# Patient Record
Sex: Female | Born: 1950 | Race: White | Hispanic: No
Health system: Northeastern US, Academic
[De-identification: ages and names within clinical notes are randomized; demographics above are authoritative.]

---

## 2019-11-05 ENCOUNTER — Inpatient Hospital Stay: Admit: 2019-11-05 | Discharge: 2019-11-05 | Payer: PRIVATE HEALTH INSURANCE

## 2019-11-05 DIAGNOSIS — Z20828 Contact with and (suspected) exposure to other viral communicable diseases: Secondary | ICD-10-CM

## 2019-11-06 DIAGNOSIS — Z20822 Contact with and (suspected) exposure to covid-19: Secondary | ICD-10-CM

## 2019-11-06 LAB — COVID-19 CLEARANCE OR FOR PLACEMENT ONLY: BKR SARS-COV-2 RNA (COVID-19) (YH): NOT DETECTED

## 2019-11-18 ENCOUNTER — Inpatient Hospital Stay: Admit: 2019-11-18 | Discharge: 2019-11-18 | Payer: PRIVATE HEALTH INSURANCE

## 2019-11-18 MED ORDER — BENZONATATE 200 MG CAPSULE
200 mg | ORAL_CAPSULE | Freq: Three times a day (TID) | ORAL | 1 refills | Status: AC | PRN
Start: 2019-11-18 — End: ?

## 2019-11-18 MED ORDER — ALBUTEROL SULFATE HFA 90 MCG/ACTUATION AEROSOL INHALER
90 mcg/actuation | Freq: Four times a day (QID) | RESPIRATORY_TRACT | 1 refills | Status: AC | PRN
Start: 2019-11-18 — End: ?

## 2019-11-18 MED ORDER — PREDNISONE 10 MG TABLET
10 mg | ORAL_TABLET | 1 refills | Status: AC
Start: 2019-11-18 — End: ?

## 2019-11-18 NOTE — ED Provider Notes
HistoryChief Complaint Patient presents with ? Cough   COUGH.PT RECENTLY TREATED FOR PNEUMONIA.  Anita Anderson is a 69 y.o. female patient who presents to the [facility] today with complaints of a cough and SOB. Patient states that she has Pneumonia last week to which she is being treated for but states that she feels better but still has a cough, wheezing, and cannot catch her breath. Notes that she finished the inhaler within a 10 day period. Notes that she had a CXR with her PCP. Denies fevers, chills, and any other medical complaints at this time. Notes that she asked for azithromycin since it always treats her but that he gave her doxycycline.  Past Medical History: Diagnosis Date ? Cervical ca [facility] Code)  ? 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 Meningitis  Past Surgical History: Procedure Laterality Date ? CHOLECYSTECTOMY   ? harrington rod surg x 3   ? HYSTERECTOMY   ? PARATHYROID GLAND SURGERY   Family History Problem Relation Age of Onset ? Depression Son  Social History Socioeconomic History ? Marital status: Divorced   Spouse name: Not on file ? Number of children: Not on file ? Years of education: Not on file ? Highest education level: Not on file Tobacco Use ? Smoking status: Former Smoker ? Smokeless tobacco: Never Used Vaping Use ? Vaping Use: Never used Substance and Sexual Activity ? Alcohol use: Yes   Comment: social ? 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 E-cigarette status Never User  ? E-Cigarette Use Never User  E-cigarette/Vaping Substances E-cigarette/Vaping Devices Review of Systems Constitutional: Negative for chills, fatigue and fever. HENT: Negative for congestion, rhinorrhea, sneezing and sore throat.  Respiratory: Positive for cough, shortness of breath and wheezing. Negative for chest tightness.  Cardiovascular: Negative for chest pain. Gastrointestinal: Negative for abdominal pain, diarrhea and vomiting. Musculoskeletal: Negative for arthralgias and myalgias. Neurological: Negative for dizziness, weakness and headaches.  Physical ExamED Triage Vitals [11/18/19 1225]BP: 128/80Pulse: 61Pulse from  O2 sat: n/aResp: 16Temp: 98.2 ?F (36.8 ?C)Temp src: n/aSpO2: 99 % BP 128/80  - Pulse 61  - Temp 98.2 ?F (36.8 ?C)  - Resp 16  - SpO2 99% Physical ExamVitals and nursing note reviewed. Constitutional:     General: She is not in acute distress.   Appearance: Normal appearance. She is not ill-appearing, toxic-appearing or diaphoretic. HENT:    Head: Normocephalic and atraumatic.    Nose: Nose normal. No congestion or rhinorrhea.    Mouth/Throat:    Mouth: Mucous membranes are moist.    Pharynx: Oropharynx is clear. No oropharyngeal exudate or posterior oropharyngeal erythema. Eyes:    General: No scleral icterus.   Conjunctiva/sclera: Conjunctivae normal. Cardiovascular:    Rate and Rhythm: Normal rate and regular rhythm.    Heart sounds: Normal heart sounds. Pulmonary:    Effort: Pulmonary effort is normal. No respiratory distress.    Breath sounds: Normal breath sounds. No wheezing, rhonchi or rales. Musculoskeletal:    Cervical back: Normal range of motion. Skin:   General: Skin is warm and dry. Neurological:    General: No focal deficit present.    Mental Status: She  is alert and oriented to person, place, and time. Psychiatric:       Mood and Affect: Mood normal.       Behavior: Behavior normal.  ProceduresProcedures ED COURSEReviewed previous: previous chartInterpreted by ED Provider: pulse oximetryPatient Reevaluation: Anita Anderson is a 69 y.o. female patient who presents to the [facility] today with complaints of a cough and SOB. Patient states that she has Pneumonia last week to which she is being treated for but states that she feels better but still has a cough, wheezing, and cannot catch her breath. Notes that she finished the inhaler within a 10 day period. Notes that she had a CXR with her PCP. Denies fevers, chills, and any other medical complaints at this time. Notes that she asked for azithromycin since it always treats her but that he gave her doxycycline. Plan:Instructed pt ZO:[id number] use inhaler and start taper prednisone as discussed.  You may also use Tessalon Perles for cough.  We do recommend that you follow-up to primary care physician.  Please drink plenty of fluidsPatient expresses understanding to the following instructions and was discharged. Diagnosis:The encounter diagnosis was Cough, persistent.Patient progress: stableClinical Impressions as of Nov 18 1306 Cough, persistent  ED DispositionDischargeBy signing my name below, I, Erling Cruz, attest that this documentation has been prepared under the direction and in the presence of Tresa Endo, Erika10/17/21 1309This chart was generated with the assistance of a scribe. I was present and performed the services documented. I have reviewed the document and verified its accuracy.  Idalia Needle, MD10/17/21 1326

## 2019-11-18 NOTE — Discharge Instructions
Please use inhaler and start taper prednisone as discussed.  You may also use Tessalon Perles for cough.  We do recommend that you follow-up to primary care physician.  Please drink plenty of fluids

## 2023-02-08 ENCOUNTER — Telehealth: Admit: 2023-02-08 | Payer: PRIVATE HEALTH INSURANCE

## 2023-02-08 NOTE — Telephone Encounter
 Received call from: Contacts   Contact Date/Time Type Contact Phone/Fax  02/08/2023 11:17 AM EST Phone (Incoming) Iverson, Beilfuss (Self) [phone] (M)   Patient needs to schedule appointment for AprilType of appointment: Office visit and LabsBest telephone number for call back: [phone] time to return call: No particular time of dayPermission to leave message: Yes

## 2023-02-16 NOTE — Telephone Encounter
 Pt is calling to schedule an appt with Dr Francoise Ceo.  Best contact number to reach pt is [phone]

## 2023-03-14 IMAGING — MR MRI LEFT SHOULDER WITHOUT CONTRAST
5 of 6 series · 27 of 40 positions shown · IV contrast (gadolinium)
Comparison: Radiographs of 02/28/2023.

________________________________________________________________________________________________ 
MRI LEFT SHOULDER WITHOUT CONTRAST, 03/14/2023 [DATE]: 
CLINICAL INDICATION: Sprain of left rotator cuff capsule, initial encounter . 
Acute on chronic. Evaluate rotator cuff. Subscapularis syndrome.
TECHNIQUE: Multiplanar, multiecho position MR images of the shoulder were 
performed without intravenous gadolinium enhancement. Patient was scanned on a 
1.5T magnet.

[Series 201: survey left · axial · left · 10.0mm · 0.71mm/px · z∈[-40,+125]mm · 5 of 15 slices shown]
[im 1/15]
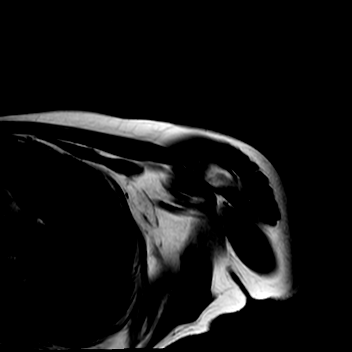
[im 4/15]
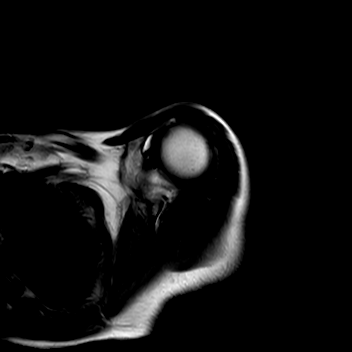
[im 8/15]
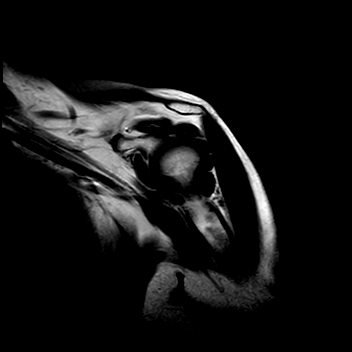
[im 11/15]
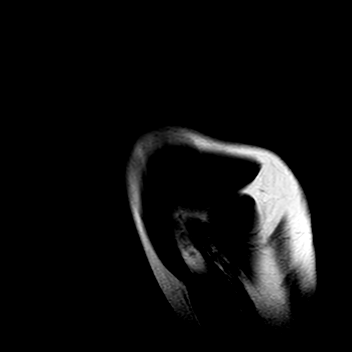
[im 15/15]
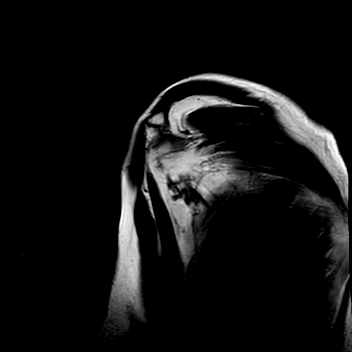

[Series 301: (person_name)_(person_name)_(person_name) · axial · left · 3.5mm · 0.42mm/px · z∈[-102,-9]mm · 7 of 28 slices shown]
[im 1/28]
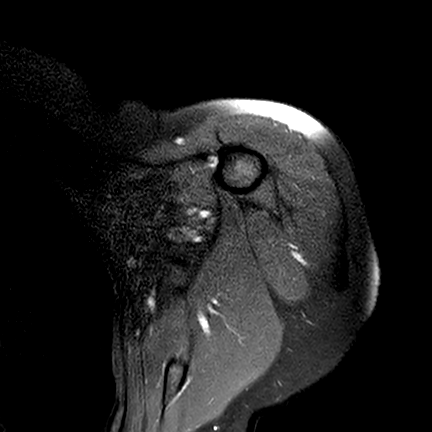
[im 5/28]
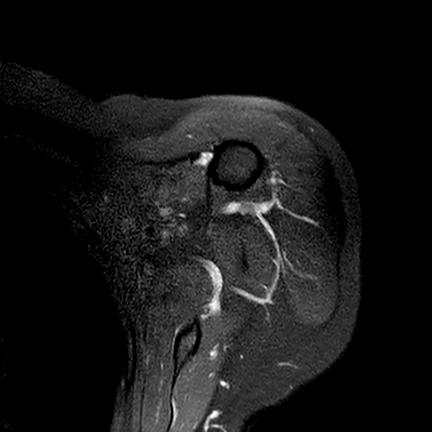
[im 10/28]
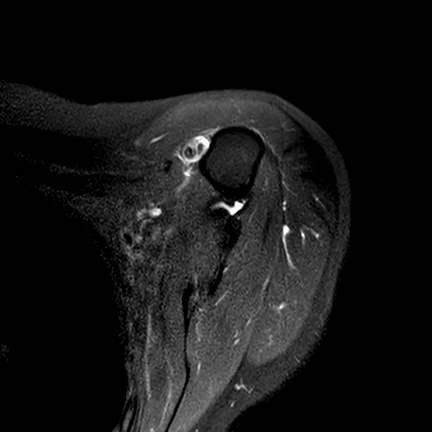
[im 14/28]
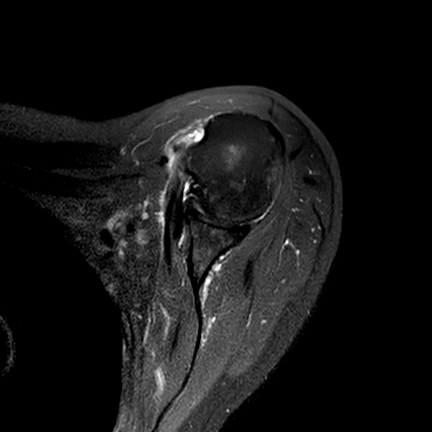
[im 19/28]
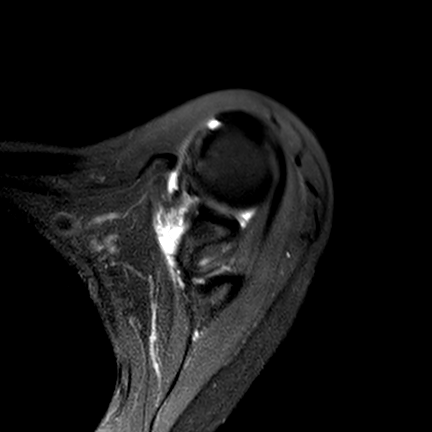
[im 23/28]
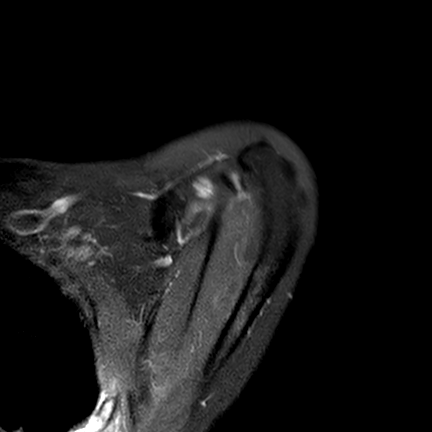
[im 28/28]
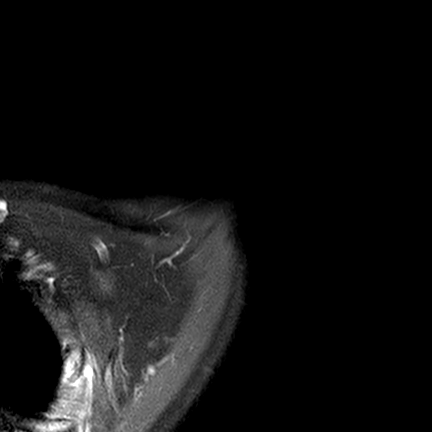

[Series 401: t2_fs_sag left · coronal · left · 3.5mm · 0.37mm/px · 8 of 30 slices shown]
[im 1/30]
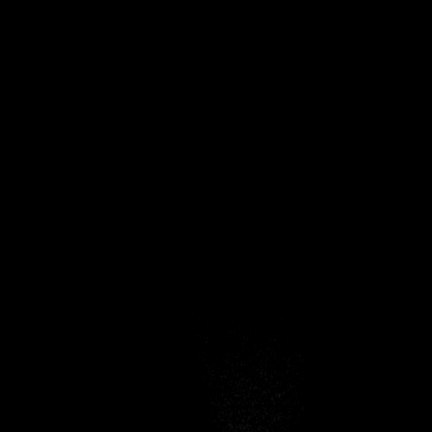
[im 5/30]
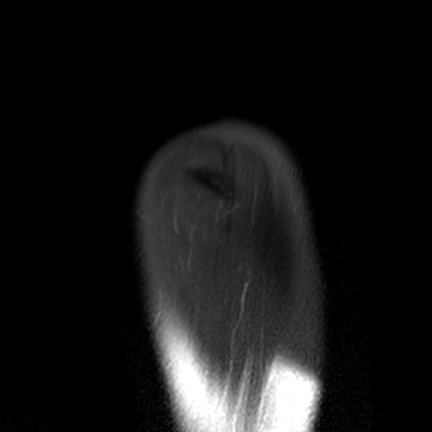
[im 9/30]
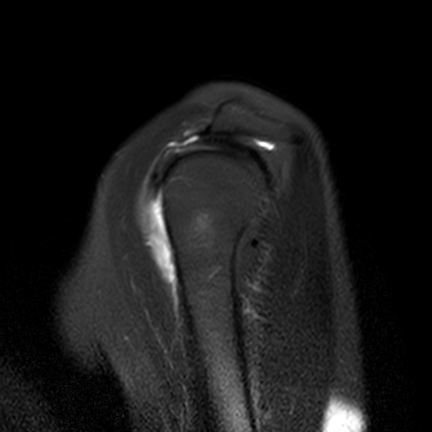
[im 13/30]
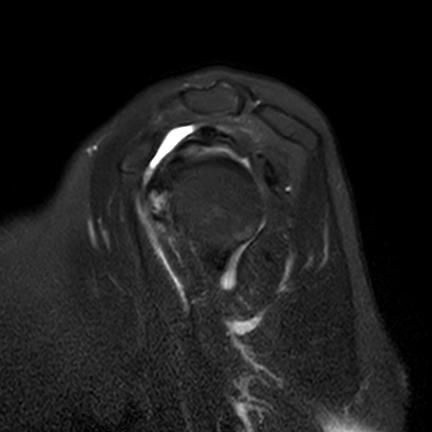
[im 17/30]
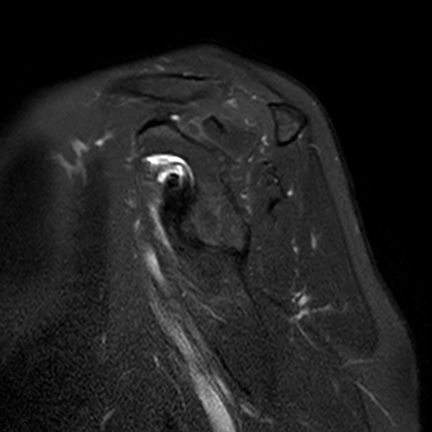
[im 21/30]
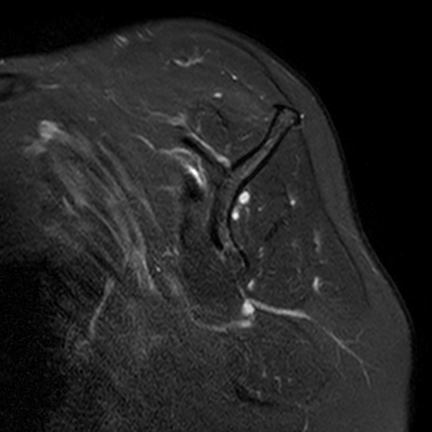
[im 25/30]
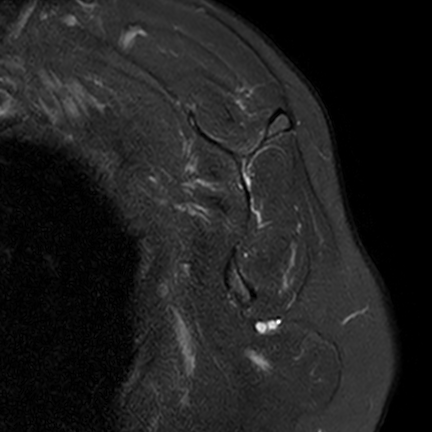
[im 30/30]
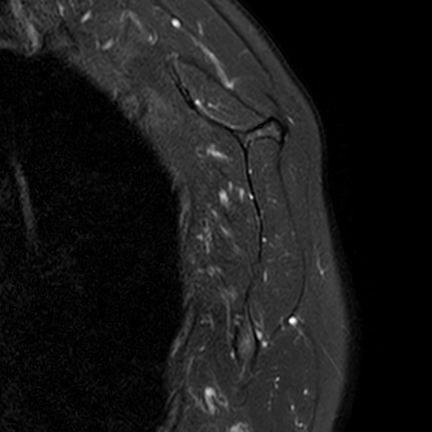

[Series 501: pd_fs_cor left · oblique · left · 3.5mm · 0.40mm/px · 6 of 24 slices shown]
[im 1/24]
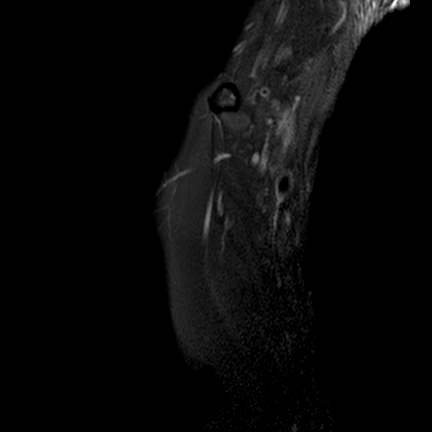
[im 5/24]
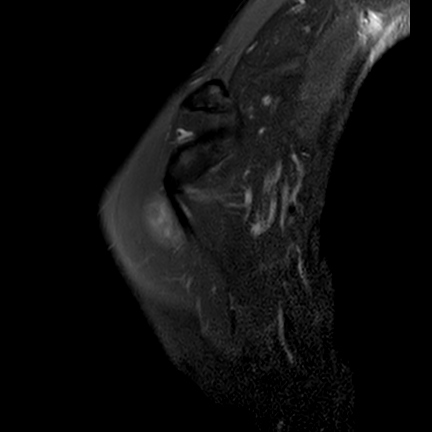
[im 10/24]
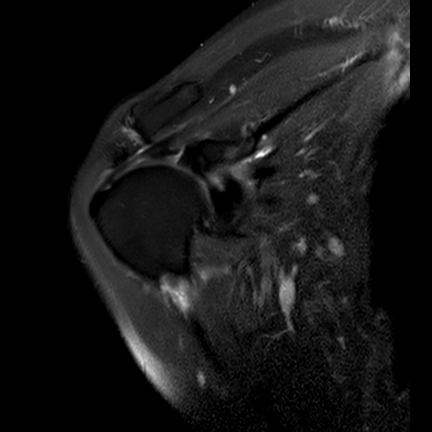
[im 14/24]
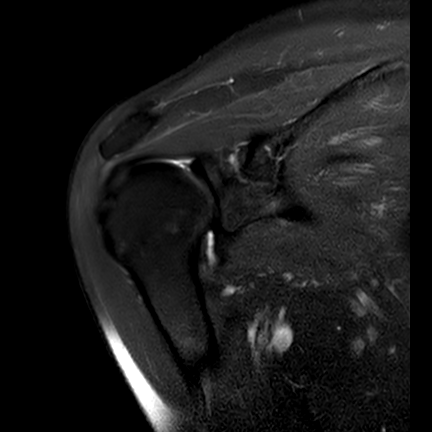
[im 19/24]
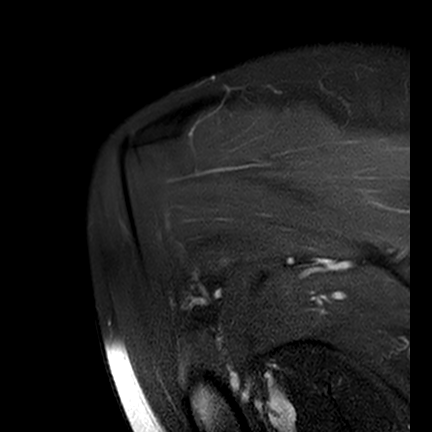
[im 24/24]
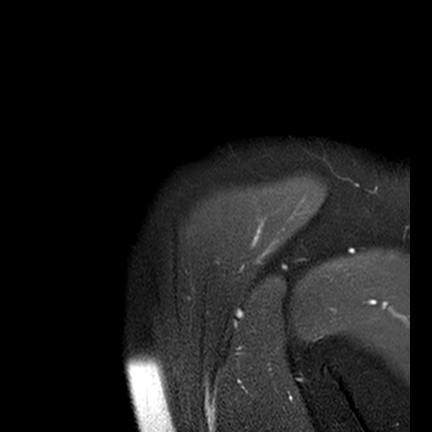

[Series 601: t1_sag left · coronal · left · 3.5mm · 0.31mm/px · 1 of 30 slices shown]
[im 1/30]
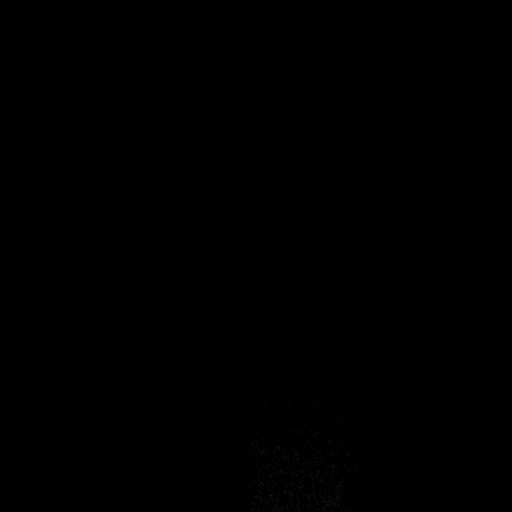

[27 of 40 positions shown; findings below may reference images not displayed]

FINDINGS: ROTATOR CUFF: There is full-thickness tear of the anterior insertion of the 
supraspinatus measuring approximately 9 mm AP. There is partial tearing of the 
superior insertion of the subscapularis. The rotator cuff musculature is 
symmetric without mass, signal abnormality or atrophy. 
ACROMIOCLAVICULAR JOINT: Minimal hypertrophic changes of the acromioclavicular 
joint without mass effect upon the supraspinatus. The coracoacromial ligament is 
intact without prominent spurring at the acromial attachment. The 
acromioclavicular and coracoclavicular ligaments are preserved. The acromium is 
normal in morphology. 
GLENOHUMERAL JOINT: The humeral head is well located within the glenoid fossa. 
Articular cartilage is preserved.  The glenoid labrum is preserved. No 
paralabral cyst. Small shoulder joint effusion. There is longitudinal split 
thickness tearing of the proximal long head biceps tendon with partial medial 
subluxation from the proximal bicipital groove. 
BONES: The bone marrow signal intensity is negative for fracture. No Hill-Sachs 
defects. 
ADDITIONAL FINDINGS: The axillary region is negative. Subcutaneous tissues are 
negative.
IMPRESSION: 1.  Full-thickness tear of the anterior insertion of the supraspinatus. 
2.  Partial tearing of the superior insertion of the subscapularis. 
3.  Medial subluxation with longitudinal split thickness tearing of the proximal 
long head of the biceps tendon.

## 2023-05-22 ENCOUNTER — Encounter: Admit: 2023-05-22 | Payer: PRIVATE HEALTH INSURANCE

## 2023-05-22 DIAGNOSIS — D472 Monoclonal gammopathy: Secondary | ICD-10-CM

## 2023-05-23 ENCOUNTER — Inpatient Hospital Stay: Admit: 2023-05-23 | Discharge: 2023-05-23 | Payer: PRIVATE HEALTH INSURANCE

## 2023-05-23 ENCOUNTER — Encounter: Admit: 2023-05-23 | Payer: PRIVATE HEALTH INSURANCE

## 2023-05-23 ENCOUNTER — Ambulatory Visit: Admit: 2023-05-23 | Payer: PRIVATE HEALTH INSURANCE

## 2023-05-23 VITALS — BP 125/81 | HR 68 | Temp 98.10000°F | Resp 18 | Ht 65.354 in | Wt 153.9 lb

## 2023-05-23 DIAGNOSIS — D472 Monoclonal gammopathy: Secondary | ICD-10-CM

## 2023-05-23 DIAGNOSIS — C539 Malignant neoplasm of cervix uteri, unspecified: Secondary | ICD-10-CM

## 2023-05-23 DIAGNOSIS — G039 Meningitis, unspecified: Secondary | ICD-10-CM

## 2023-05-23 DIAGNOSIS — C541 Malignant neoplasm of endometrium: Secondary | ICD-10-CM

## 2023-05-23 LAB — CBC WITH AUTO DIFFERENTIAL
BKR WAM ABSOLUTE IMMATURE GRANULOCYTES.: 0.01 x 1000/ÂµL (ref 0.00–0.30)
BKR WAM ABSOLUTE LYMPHOCYTE COUNT.: 1.74 x 1000/ÂµL (ref 0.60–3.70)
BKR WAM ABSOLUTE NRBC (2 DEC): 0 x 1000/ÂµL (ref 0.00–1.00)
BKR WAM ANC (ABSOLUTE NEUTROPHIL COUNT): 2.55 x 1000/ÂµL (ref 2.00–7.60)
BKR WAM BASOPHIL ABSOLUTE COUNT.: 0.04 x 1000/ÂµL (ref 0.00–1.00)
BKR WAM BASOPHILS: 0.8 % (ref 0.0–1.4)
BKR WAM EOSINOPHIL ABSOLUTE COUNT.: 0.07 x 1000/ÂµL (ref 0.00–1.00)
BKR WAM EOSINOPHILS: 1.5 % (ref 0.0–5.0)
BKR WAM HEMATOCRIT (2 DEC): 42.5 % (ref 35.00–45.00)
BKR WAM HEMOGLOBIN: 14.4 g/dL (ref 11.7–15.5)
BKR WAM IMMATURE GRANULOCYTES: 0.2 % (ref 0.0–1.0)
BKR WAM LYMPHOCYTES: 36.4 % (ref 17.0–50.0)
BKR WAM MCH (PG): 32.4 pg (ref 27.0–33.0)
BKR WAM MCHC: 33.9 g/dL (ref 31.0–36.0)
BKR WAM MCV: 95.7 fL (ref 80.0–100.0)
BKR WAM MONOCYTE ABSOLUTE COUNT.: 0.37 x 1000/ÂµL (ref 0.00–1.00)
BKR WAM MONOCYTES: 7.7 % (ref 4.0–12.0)
BKR WAM MPV: 10.1 fL (ref 8.0–12.0)
BKR WAM NEUTROPHILS: 53.4 % (ref 39.0–72.0)
BKR WAM NUCLEATED RED BLOOD CELLS: 0 % (ref 0.0–1.0)
BKR WAM PLATELETS: 259 x1000/ÂµL (ref 150–420)
BKR WAM RDW-CV: 13.2 % (ref 11.0–15.0)
BKR WAM RED BLOOD CELL COUNT.: 4.44 M/ÂµL (ref 4.00–6.00)
BKR WAM WHITE BLOOD CELL COUNT: 4.8 x1000/ÂµL (ref 4.0–11.0)

## 2023-05-23 LAB — COMPREHENSIVE METABOLIC PANEL
BKR A/G RATIO: 0.8 — ABNORMAL LOW (ref 1.0–2.2)
BKR ALANINE AMINOTRANSFERASE (ALT): 15 U/L (ref 10–35)
BKR ALBUMIN: 4 g/dL (ref 3.6–5.1)
BKR ALKALINE PHOSPHATASE: 76 U/L (ref 9–122)
BKR ASPARTATE AMINOTRANSFERASE (AST): 20 U/L (ref 10–35)
BKR AST/ALT RATIO: 1.3
BKR BILIRUBIN TOTAL: 0.5 mg/dL (ref <=1.2)
BKR BLOOD UREA NITROGEN: 15 mg/dL (ref 8–23)
BKR BUN / CREAT RATIO: 16.9 (ref 8.0–23.0)
BKR CALCIUM: 9.9 mg/dL (ref 8.8–10.2)
BKR CHLORIDE: 104 mmol/L (ref 98–107)
BKR CO2: 24 mmol/L (ref 20–30)
BKR CREATININE: 0.89 mg/dL (ref 0.40–1.30)
BKR EGFR, CREATININE (CKD-EPI 2021): >60 mL/min/{1.73_m2} (ref >=60)
BKR GLOBULIN: 5.1 g/dL — ABNORMAL HIGH (ref 2.0–3.9)
BKR GLUCOSE: 103 mg/dL — ABNORMAL HIGH (ref 70–100)
BKR POTASSIUM: 4.1 mmol/L (ref 3.3–5.1)
BKR PROTEIN TOTAL: 9.1 g/dL — ABNORMAL HIGH (ref 5.9–8.3)

## 2023-05-23 LAB — FREE KAPPA LAMBDA WITH RATIO, SERUM
BKR KAPPA FLC, SERUM: 0.91 mg/dL (ref 0.33–1.94)
BKR KAPPA/LAMBDA FLC RATIO, SERUM: 2.6 — ABNORMAL HIGH (ref 0.26–1.65)
BKR LAMBDA FLC, SERUM: 0.35 mg/dL — ABNORMAL LOW (ref 0.57–2.63)

## 2023-05-23 LAB — TOTAL PROTEIN AND ALBUMIN,SERUM  (YH)
BKR ALBUMIN, SERUM: 4.1 g/dL (ref 3.6–4.9)
BKR TOTAL PROTEIN, SERUM: 8.3 g/dL (ref 6.0–8.3)

## 2023-05-23 LAB — DIRECT SODIUM (LAB ORDERABLE ONLY) (BH YH): BKR DIRECT SODIUM: 141 mmol/L (ref 136–144)

## 2023-05-23 LAB — IMMUNOGLOBULINS IGG, IGA, IGM
BKR IGA: 13 mg/dL — ABNORMAL LOW (ref 70–470)
BKR IGG: 3568 mg/dL — ABNORMAL HIGH (ref 700–1600)
BKR IGM: 11 mg/dL — ABNORMAL LOW (ref 40–230)

## 2023-05-24 ENCOUNTER — Telehealth: Admit: 2023-05-24 | Payer: PRIVATE HEALTH INSURANCE

## 2023-05-24 LAB — PROTEIN ELECTROPHORESIS, SERUM    (YH)
BKR ALBUMIN ELP: 4.24 g/dL (ref 3.50–4.70)
BKR ALPHA-1-GLOBULIN: 0.17 g/dL (ref 0.10–0.30)
BKR ALPHA-2-GLOBULIN: 0.72 g/dL (ref 0.60–1.00)
BKR BETA GLOBULIN: 0.62 g/dL — ABNORMAL LOW (ref 0.70–1.20)
BKR GAMMA GLOBULIN: 2.54 g/dL — ABNORMAL HIGH (ref 0.70–1.50)

## 2023-05-24 NOTE — Telephone Encounter
 Spoke with patient regarding labs, M spike and light chain ratio stable. Follow up as planned.

## 2023-05-24 NOTE — Progress Notes
 Patient: Anita Anderson (August 21, 1950)MRN: [id number] Provider: Delice Felt, MDDate of service: 4/21/2025FOLLOWUP NOTEDIAGNOSIS:10/14/20: 73 yo female with pmh history of endometrial and cervical cancer s/p TAH, scoliosis s/p Harrington rod placement complicated by osteomyelitis treated with IV abx, pancreatitis and recurrent pneumonia who is referred by Dr. 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 She says that she was told she had multiple myeloma and needed a stem cell transplant around 2005 at [facility]. She was given a different opinion and [facility], namely that she did not have MM. She says her latest bone marrow biopsy was in 2017 at [facility]. She says she has not had her MGUS evaluated since that time by a hematologist. She feels very well and has no complaints. She denies fevers, chills, night sweats, weight loss, chest pain, shortness of breath, abdominal pain, nausea, vomiting, diarrhea, early satiety, abnormal bleeding or bruising, leg pain or swelling, rashes, bone pain or joint pain.INTERVAL HISTORY: 05/23/23: Feels well other than expected pain in her left shoulder as she's 5 weeks post-rotator cuff repair. No other sites of pain. Energy is normal.Medication: Medication reconciliation was performed and available in the electronic medical record.ROS: (pertinent positives in bold)General:	 weakness, fatigue, fever, weight loss, anorexiaSkin: rashes, pruritus, infections, bruising or bleedingHEENT:  headaches, eye pain, blurred vision, hearing loss, earache, postnasal drip, sore throat, dysphagia, Resp:  cough, sputum, hemoptysis, wheeze, SOB, DOECardiac:	chest pain, chest pressure or palpitationsGI:  nausea, vomiting, abdominal pain, cramping, diarrhea, constipation, melenaGU:   urinary urgency, frequency, dysuria, nocturia, discharge, lesionsExt:  swelling, pain, cramps, paresthesiasMS: joint swelling, joint pain, muscle pain, backacheNeuro:  tremors, sensory loss, weakness  Psych: agitation, anxiety, depressionPHYSICAL EXAM:Vitals:  05/23/23 1036 BP: 125/81 Pulse: 68 Resp: 18 Temp: 98.1 ?F (36.7 ?C) Weight: 69.8 kg GEN: WD/WN, no acute distressENT: oropharynx without thrush, mucositis, or bleeding. EYES: sclera anicteric, no ptosis; pupils equal, reactive to light.RESP: normal respiratory effort; lungs clear to auscultation; and percussion.CV: regular rate and rhythm, normal S1 and S2, no murmurs; no peripheral edema.ABDOMEN: soft, NT/ND, no palpable masses; no hepato- or splenomegaly.LYMPH: no palpable cervical, supraclavicular, axillary, or inguinal adenopathy.MUSCULOSKELETAL: no clubbing or cyanosis; no tenderness over the ribs, spine or long bones.SKIN: no rash or petechiae.PSYCH: judgement and insight normal; no depression or anxiety.NEURO: alert and oriented x 3; no focal motor deficits. 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 Marrow biopsy done on 10/29/20 showed 10% plasma cells with kappa restriction. No current evidence of CRAB. Continue to monitor closely.-await spep, light chains, immunoglobulins.-follow up in four months with cbc, cmp, spep, light chains, immunoglobulins. Collins Dean, MDElectronically Signed by Delice Felt, MD, April 21, 2025On the day of this patient's encounter, a total of 30 minutes was personally spent by me.  This does not include any resident/fellow teaching time, or any time spent performing a procedural service.

## 2023-05-25 LAB — IMMUNOFIXATION ELECTROPHORESIS, SERUM

## 2023-06-06 ENCOUNTER — Ambulatory Visit: Admit: 2023-06-06 | Payer: PRIVATE HEALTH INSURANCE

## 2023-07-13 ENCOUNTER — Telehealth: Admit: 2023-07-13 | Payer: PRIVATE HEALTH INSURANCE

## 2023-07-13 NOTE — Telephone Encounter
 Left a detailed message to call and r/s Md and Labs provide is on vacation.Gb

## 2023-08-19 ENCOUNTER — Ambulatory Visit: Admit: 2023-08-19 | Payer: PRIVATE HEALTH INSURANCE

## 2023-09-04 ENCOUNTER — Encounter: Admit: 2023-09-04 | Payer: PRIVATE HEALTH INSURANCE

## 2023-09-04 DIAGNOSIS — D472 Monoclonal gammopathy: Principal | ICD-10-CM

## 2023-09-05 ENCOUNTER — Encounter: Admit: 2023-09-05 | Payer: PRIVATE HEALTH INSURANCE

## 2023-09-05 ENCOUNTER — Inpatient Hospital Stay: Admit: 2023-09-05 | Discharge: 2023-09-05 | Payer: PRIVATE HEALTH INSURANCE

## 2023-09-05 ENCOUNTER — Ambulatory Visit: Admit: 2023-09-05 | Payer: PRIVATE HEALTH INSURANCE

## 2023-09-05 VITALS — BP 127/74 | HR 64 | Temp 98.00000°F | Resp 17 | Wt 156.3 lb

## 2023-09-05 DIAGNOSIS — C541 Malignant neoplasm of endometrium: Secondary | ICD-10-CM

## 2023-09-05 DIAGNOSIS — D472 Monoclonal gammopathy: Principal | ICD-10-CM

## 2023-09-05 DIAGNOSIS — C539 Malignant neoplasm of cervix uteri, unspecified: Secondary | ICD-10-CM

## 2023-09-05 DIAGNOSIS — G039 Meningitis, unspecified: Principal | ICD-10-CM

## 2023-09-05 LAB — COMPREHENSIVE METABOLIC PANEL
BKR A/G RATIO: 0.8 — ABNORMAL LOW (ref 1.0–2.2)
BKR ALANINE AMINOTRANSFERASE (ALT): 17 U/L (ref 10–35)
BKR ALBUMIN: 4.1 g/dL (ref 3.6–5.1)
BKR ALKALINE PHOSPHATASE: 87 U/L (ref 9–122)
BKR ANION GAP: 10 (ref 7–17)
BKR ASPARTATE AMINOTRANSFERASE (AST): 22 U/L (ref 10–35)
BKR AST/ALT RATIO: 1.3
BKR BILIRUBIN TOTAL: 0.7 mg/dL (ref <=1.2)
BKR BLOOD UREA NITROGEN: 13 mg/dL (ref 8–23)
BKR BUN / CREAT RATIO: 16.9 (ref 8.0–23.0)
BKR CALCIUM: 9.8 mg/dL (ref 8.8–10.2)
BKR CHLORIDE: 106 mmol/L (ref 98–107)
BKR CO2: 23 mmol/L (ref 20–30)
BKR CREATININE DELTA: -0.12
BKR CREATININE: 0.77 mg/dL (ref 0.40–1.30)
BKR EGFR, CREATININE (CKD-EPI 2021): >60 mL/min/1.73m2 (ref >=60)
BKR GLOBULIN: 4.9 g/dL — ABNORMAL HIGH (ref 2.0–3.9)
BKR GLUCOSE: 97 mg/dL (ref 70–100)
BKR POTASSIUM: 4.1 mmol/L (ref 3.3–5.1)
BKR PROTEIN TOTAL: 9 g/dL — ABNORMAL HIGH (ref 5.9–8.3)
BKR SODIUM: 139 mmol/L (ref 136–144)

## 2023-09-05 LAB — CBC WITH AUTO DIFFERENTIAL
BKR WAM ABSOLUTE IMMATURE GRANULOCYTES.: 0.01 x 1000/ÂµL (ref 0.00–0.30)
BKR WAM ABSOLUTE LYMPHOCYTE COUNT.: 1.06 x 1000/ÂµL (ref 0.60–3.70)
BKR WAM ABSOLUTE NRBC: 0 x 1000/ÂµL (ref 0.00–1.00)
BKR WAM ANC (ABSOLUTE NEUTROPHIL COUNT): 2.64 x 1000/ÂµL (ref 2.00–7.60)
BKR WAM BASOPHIL ABSOLUTE COUNT.: 0.02 x 1000/ÂµL (ref 0.00–1.00)
BKR WAM BASOPHILS: 0.5 % (ref 0.0–1.4)
BKR WAM EOSINOPHIL ABSOLUTE COUNT.: 0.03 x 1000/ÂµL (ref 0.00–1.00)
BKR WAM EOSINOPHILS: 0.8 % (ref 0.0–5.0)
BKR WAM HEMATOCRIT: 43.7 % (ref 35.00–45.00)
BKR WAM HEMOGLOBIN: 14.6 g/dL (ref 11.7–15.5)
BKR WAM IMMATURE GRANULOCYTES: 0.3 % (ref 0.0–1.0)
BKR WAM LYMPHOCYTES: 26.7 % (ref 17.0–50.0)
BKR WAM MCH: 31.6 pg (ref 27.0–33.0)
BKR WAM MCHC: 33.4 g/dL (ref 31.0–36.0)
BKR WAM MCV: 94.6 fL (ref 80.0–100.0)
BKR WAM MONOCYTE ABSOLUTE COUNT.: 0.21 x 1000/ÂµL (ref 0.00–1.00)
BKR WAM MONOCYTES: 5.3 % (ref 4.0–12.0)
BKR WAM MPV: 10.7 fL (ref 8.0–12.0)
BKR WAM NEUTROPHILS: 66.4 % (ref 39.0–72.0)
BKR WAM NUCLEATED RED BLOOD CELLS: 0 % (ref 0.0–1.0)
BKR WAM PLATELETS: 227 x1000/ÂµL (ref 150–420)
BKR WAM RDW-CV: 13.2 % (ref 11.0–15.0)
BKR WAM RED BLOOD CELL COUNT.: 4.62 M/ÂµL (ref 4.00–6.00)
BKR WAM WHITE BLOOD CELL COUNT: 4 x1000/ÂµL (ref 4.0–11.0)

## 2023-09-05 LAB — TOTAL PROTEIN AND ALBUMIN,SERUM  (YH)
BKR ALBUMIN, SERUM: 4.1 g/dL (ref 3.6–4.9)
BKR TOTAL PROTEIN, SERUM: 8 g/dL (ref 6.0–8.3)

## 2023-09-05 LAB — IMMUNOGLOBULINS IGG, IGA, IGM
BKR IGA: 13 mg/dL — ABNORMAL LOW (ref 70–470)
BKR IGG: 3190 mg/dL — ABNORMAL HIGH (ref 700–1600)
BKR IGM: 10 mg/dL — ABNORMAL LOW (ref 40–230)

## 2023-09-05 MED ORDER — DICLOFENAC SODIUM 75 MG TABLET,DELAYED RELEASE
75 | Freq: Two times a day (BID) | ORAL | 1.00 refills | 30.00000 days | Status: AC
Start: 2023-09-05 — End: ?

## 2023-09-05 NOTE — Progress Notes
 Patient: Anita Anderson ([birth year])MRN: [id number] Provider: Toribio Omega Grow, MDDate of service: 8/4/2025FOLLOWUP NOTEDIAGNOSIS:10/14/20: 73 yo female with pmh history of endometrial and cervical cancer s/p TAH, scoliosis s/p Harrington rod placement complicated by osteomyelitis treated with IV abx, pancreatitis and recurrent pneumonia who is referred by Dr. 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 Anita Anderson says she was first told that she had an MGUS in her 52s. She says that she was told she had multiple myeloma and needed a stem cell transplant around 2005 at [facility]. She was given a different opinion and [facility], namely that she did not have MM. She says her latest bone marrow biopsy was in 2017 at [facility]. She says she has not had her MGUS evaluated since that time by a hematologist. She feels very well and has no complaints. She denies fevers, chills, night sweats, weight loss, chest pain, shortness of breath, abdominal pain, nausea, vomiting, diarrhea, early satiety, abnormal bleeding or bruising, leg pain or swelling, rashes, bone pain or joint pain.INTERVAL HISTORY: 09/05/23: Feels well overall. Left shoulder pain much improved after tendon repair surgery. Energy is normal.Medication: Medication reconciliation was performed and available in the electronic medical record.ROS: (pertinent positives in bold)General:	 weakness, fatigue, fever, weight loss, anorexiaSkin: rashes, pruritus, infections, bruising or bleedingHEENT:  headaches, eye pain, blurred vision, hearing loss, earache, postnasal drip, sore throat, dysphagia, Resp:  cough, sputum, hemoptysis, wheeze, SOB, DOECardiac:	chest pain, chest pressure or palpitationsGI: nausea, vomiting, abdominal pain, cramping, diarrhea, constipation, melenaGU:   urinary urgency, frequency, dysuria, nocturia, discharge, lesionsExt:  swelling, pain, cramps, paresthesiasMS: joint swelling, joint pain, muscle pain, backacheNeuro:  tremors, sensory loss, weakness  Psych: agitation, anxiety, depressionPHYSICAL EXAM:Vitals:  09/05/23 1331 BP: 127/74 Pulse: 64 Resp: 17 Temp: 98 ?F (36.7 ?C) Weight: 70.9 kg GEN: WD/WN, no acute distressENT: oropharynx without thrush, mucositis, or bleeding. EYES: sclera anicteric, no ptosis; pupils equal, reactive to light.RESP: normal respiratory effort; lungs clear to auscultation; and percussion.CV: regular rate and rhythm, normal S1 and S2, no murmurs; no peripheral edema.ABDOMEN: soft, NT/ND, no palpable masses; no hepato- or splenomegaly.LYMPH: no palpable cervical, supraclavicular, axillary, or inguinal adenopathy.MUSCULOSKELETAL: no clubbing or cyanosis; no tenderness over the ribs, spine or long bones.SKIN: no rash or petechiae.PSYCH: judgement and insight normal; no depression or anxiety.NEURO: alert and oriented x 3; no focal motor deficits. 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 Marrow biopsy done on 10/29/20 showed 10% plasma cells with kappa restriction. No current evidence of CRAB. Continue to monitor closely.-await spep, light chains, immunoglobulins.-follow up in four months with cbc, cmp, spep, light chains, immunoglobulins. Toribio Grow, MDElectronically Signed by Toribio Omega Grow, MD, August 4, 2025On the day of this patient's encounter, a total of 30 minutes was personally spent by me.  This does not include any resident/fellow teaching time, or any time spent performing a procedural service.

## 2023-09-06 LAB — FREE KAPPA LAMBDA WITH RATIO, SERUM
BKR KAPPA FLC, SERUM: 0.78 mg/dL (ref 0.33–1.94)
BKR KAPPA/LAMBDA FLC RATIO, SERUM: 2.29 — ABNORMAL HIGH (ref 0.26–1.65)
BKR LAMBDA FLC, SERUM: 0.34 mg/dL — ABNORMAL LOW (ref 0.57–2.63)

## 2023-09-07 ENCOUNTER — Telehealth: Admit: 2023-09-07 | Payer: PRIVATE HEALTH INSURANCE

## 2023-09-07 LAB — PROTEIN ELECTROPHORESIS, SERUM    (YH)
BKR ALBUMIN ELP: 4.22 g/dL (ref 3.50–4.70)
BKR ALPHA-1-GLOBULIN: 0.14 g/dL (ref 0.10–0.30)
BKR ALPHA-2-GLOBULIN: 0.67 g/dL (ref 0.60–1.00)
BKR BETA GLOBULIN: 0.58 g/dL — ABNORMAL LOW (ref 0.70–1.20)
BKR GAMMA GLOBULIN: 2.39 g/dL — ABNORMAL HIGH (ref 0.70–1.50)

## 2023-09-07 NOTE — Telephone Encounter
 Discussed labs with Candis. Continue to monitor.

## 2023-09-13 ENCOUNTER — Ambulatory Visit: Admit: 2023-09-13 | Payer: PRIVATE HEALTH INSURANCE

## 2023-09-23 ENCOUNTER — Inpatient Hospital Stay: Admit: 2023-09-23 | Discharge: 2023-09-23 | Payer: PRIVATE HEALTH INSURANCE

## 2023-09-23 DIAGNOSIS — D472 Monoclonal gammopathy: Secondary | ICD-10-CM

## 2023-09-23 DIAGNOSIS — R0602 Shortness of breath: Principal | ICD-10-CM

## 2023-09-23 DIAGNOSIS — G7111 Myotonic muscular dystrophy: Secondary | ICD-10-CM

## 2023-09-23 DIAGNOSIS — Z820 Family history of epilepsy and other diseases of the nervous system: Secondary | ICD-10-CM

## 2023-12-06 ENCOUNTER — Encounter: Admit: 2023-12-06 | Payer: PRIVATE HEALTH INSURANCE | Attending: Cardiovascular Disease

## 2023-12-06 VITALS — BP 130/72 | HR 68 | Ht 65.0 in | Wt 148.0 lb

## 2023-12-06 DIAGNOSIS — C541 Malignant neoplasm of endometrium: Secondary | ICD-10-CM

## 2023-12-06 DIAGNOSIS — R0602 Shortness of breath: Principal | ICD-10-CM

## 2023-12-06 DIAGNOSIS — D472 Monoclonal gammopathy: Secondary | ICD-10-CM

## 2023-12-06 DIAGNOSIS — G7111 Myotonic muscular dystrophy: Secondary | ICD-10-CM

## 2023-12-06 DIAGNOSIS — G039 Meningitis, unspecified: Principal | ICD-10-CM

## 2023-12-06 DIAGNOSIS — C539 Malignant neoplasm of cervix uteri, unspecified: Secondary | ICD-10-CM

## 2023-12-06 DIAGNOSIS — R0600 Dyspnea, unspecified: Secondary | ICD-10-CM

## 2023-12-06 DIAGNOSIS — Z820 Family history of epilepsy and other diseases of the nervous system: Secondary | ICD-10-CM

## 2023-12-16 ENCOUNTER — Ambulatory Visit: Admit: 2023-12-16 | Payer: PRIVATE HEALTH INSURANCE

## 2023-12-16 ENCOUNTER — Inpatient Hospital Stay: Admit: 2023-12-16 | Discharge: 2023-12-16 | Payer: PRIVATE HEALTH INSURANCE

## 2023-12-16 ENCOUNTER — Encounter: Admit: 2023-12-16 | Payer: PRIVATE HEALTH INSURANCE

## 2023-12-16 DIAGNOSIS — D472 Monoclonal gammopathy: Principal | ICD-10-CM

## 2023-12-16 DIAGNOSIS — C541 Malignant neoplasm of endometrium: Secondary | ICD-10-CM

## 2023-12-16 DIAGNOSIS — G039 Meningitis, unspecified: Principal | ICD-10-CM

## 2023-12-16 DIAGNOSIS — C539 Malignant neoplasm of cervix uteri, unspecified: Secondary | ICD-10-CM

## 2023-12-16 LAB — CBC WITH AUTO DIFFERENTIAL
BKR WAM ABSOLUTE IMMATURE GRANULOCYTES.: 0.01 x 1000/ÂµL (ref 0.00–0.30)
BKR WAM ABSOLUTE LYMPHOCYTE COUNT.: 1.12 x 1000/ÂµL (ref 0.60–3.70)
BKR WAM ABSOLUTE NRBC: 0 x 1000/ÂµL (ref 0.00–1.00)
BKR WAM ANC (ABSOLUTE NEUTROPHIL COUNT): 2.88 x 1000/ÂµL (ref 2.00–7.60)
BKR WAM BASOPHIL ABSOLUTE COUNT.: 0.03 x 1000/ÂµL (ref 0.00–1.00)
BKR WAM BASOPHILS: 0.7 % (ref 0.0–1.4)
BKR WAM EOSINOPHIL ABSOLUTE COUNT.: 0.08 x 1000/ÂµL (ref 0.00–1.00)
BKR WAM EOSINOPHILS: 1.8 % (ref 0.0–5.0)
BKR WAM HEMATOCRIT: 44.9 % (ref 35.00–45.00)
BKR WAM HEMOGLOBIN: 14.9 g/dL (ref 11.7–15.5)
BKR WAM IMMATURE GRANULOCYTES: 0.2 % (ref 0.0–1.0)
BKR WAM LYMPHOCYTES: 25.3 % (ref 17.0–50.0)
BKR WAM MCH: 31.6 pg (ref 27.0–33.0)
BKR WAM MCHC: 33.2 g/dL (ref 31.0–36.0)
BKR WAM MCV: 95.1 fL (ref 80.0–100.0)
BKR WAM MONOCYTE ABSOLUTE COUNT.: 0.31 x 1000/ÂµL (ref 0.00–1.00)
BKR WAM MONOCYTES: 7 % (ref 4.0–12.0)
BKR WAM MPV: 10.1 fL (ref 8.0–12.0)
BKR WAM NEUTROPHILS: 65 % (ref 39.0–72.0)
BKR WAM NUCLEATED RED BLOOD CELLS: 0 % (ref 0.0–1.0)
BKR WAM PLATELETS: 234 x1000/ÂµL (ref 150–420)
BKR WAM RDW-CV: 12.9 % (ref 11.0–15.0)
BKR WAM RED BLOOD CELL COUNT.: 4.72 M/ÂµL (ref 4.00–6.00)
BKR WAM WHITE BLOOD CELL COUNT: 4.4 x1000/ÂµL (ref 4.0–11.0)

## 2023-12-16 LAB — COMPREHENSIVE METABOLIC PANEL
BKR A/G RATIO: 0.9 — ABNORMAL LOW (ref 1.0–2.2)
BKR ALANINE AMINOTRANSFERASE (ALT): 13 U/L (ref 10–35)
BKR ALBUMIN: 4 g/dL (ref 3.6–5.1)
BKR ALKALINE PHOSPHATASE: 75 U/L (ref 9–122)
BKR ANION GAP: 11 (ref 7–17)
BKR ASPARTATE AMINOTRANSFERASE (AST): 26 U/L (ref 10–35)
BKR AST/ALT RATIO: 2
BKR BILIRUBIN TOTAL: 1.3 mg/dL — ABNORMAL HIGH (ref <=1.2)
BKR BLOOD UREA NITROGEN: 13 mg/dL (ref 8–23)
BKR BUN / CREAT RATIO: 16 (ref 8.0–23.0)
BKR CALCIUM: 10 mg/dL (ref 8.8–10.2)
BKR CHLORIDE: 104 mmol/L (ref 98–107)
BKR CO2: 22 mmol/L (ref 20–30)
BKR CREATININE DELTA: 0.04
BKR CREATININE: 0.81 mg/dL (ref 0.40–1.30)
BKR EGFR, CREATININE (CKD-EPI 2021): >60 mL/min/1.73m2 (ref >=60)
BKR GLOBULIN: 4.5 g/dL — ABNORMAL HIGH (ref 2.0–3.9)
BKR GLUCOSE: 92 mg/dL (ref 70–100)
BKR POTASSIUM: 4.7 mmol/L (ref 3.3–5.1)
BKR PROTEIN TOTAL: 8.5 g/dL — ABNORMAL HIGH (ref 5.9–8.3)
BKR SODIUM: 137 mmol/L (ref 136–144)

## 2023-12-16 LAB — IMMUNOGLOBULINS IGG, IGA, IGM
BKR IGA: 15 mg/dL — ABNORMAL LOW (ref 70–470)
BKR IGG: 3548 mg/dL — ABNORMAL HIGH (ref 700–1600)
BKR IGM: 13 mg/dL — ABNORMAL LOW (ref 40–230)

## 2023-12-16 LAB — FREE KAPPA LAMBDA WITH RATIO, SERUM
BKR KAPPA FLC, SERUM: 0.87 mg/dL (ref 0.33–1.94)
BKR KAPPA/LAMBDA FLC RATIO, SERUM: 2.29 — ABNORMAL HIGH (ref 0.26–1.65)
BKR LAMBDA FLC, SERUM: 0.38 mg/dL — ABNORMAL LOW (ref 0.57–2.63)

## 2023-12-16 LAB — TOTAL PROTEIN AND ALBUMIN,SERUM  (YH)
BKR ALBUMIN, SERUM: 4 g/dL (ref 3.6–4.9)
BKR TOTAL PROTEIN, SERUM: 8.1 g/dL (ref 6.0–8.3)

## 2023-12-16 NOTE — Progress Notes [1]
 Patient: Anita Anderson (04-23-1950)MRN: [id number] Provider: Toribio Omega Grow, MDDate of service: 11/14/2025FOLLOWUP NOTEDIAGNOSIS:10/14/20: 73 yo female with pmh history of endometrial and cervical cancer s/p TAH, scoliosis s/p Harrington rod placement complicated by osteomyelitis treated with IV abx, pancreatitis and recurrent pneumonia who is referred by Dr. 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 Anita Anderson says she was first told that she had an MGUS in her 60s. She says that she was told she had multiple myeloma and needed a stem cell transplant around 2005 at [facility]. She was given a different opinion and [facility], namely that she did not have MM. She says her latest bone marrow biopsy was in 2017 at [facility]. She says she has not had her MGUS evaluated since that time by a hematologist. She feels very well and has no complaints. She denies fevers, chills, night sweats, weight loss, chest pain, shortness of breath, abdominal pain, nausea, vomiting, diarrhea, early satiety, abnormal bleeding or bruising, leg pain or swelling, rashes, bone pain or joint pain.INTERVAL HISTORY: 12/16/23: Feels well overall. Energy is normal. She did have right leg pain (femur) but this has resolved. No other sites of pain.Medication: Medication reconciliation was performed and available in the electronic medical record.ROS: (pertinent positives in bold)General:	 weakness, fatigue, fever, weight loss, anorexiaSkin: rashes, pruritus, infections, bruising or bleedingHEENT:  headaches, eye pain, blurred vision, hearing loss, earache, postnasal drip, sore throat, dysphagia, Resp:  cough, sputum, hemoptysis, wheeze, SOB, DOECardiac:	chest pain, chest pressure or palpitationsGI:  nausea, vomiting, abdominal pain, cramping, diarrhea, constipation, melenaGU:   urinary urgency, frequency, dysuria, nocturia, discharge, lesionsExt:  swelling, pain, cramps, paresthesiasMS: joint swelling, joint pain, muscle pain, backacheNeuro:  tremors, sensory loss, weakness  Psych: agitation, anxiety, depressionPHYSICAL EXAM:Vitals:  12/16/23 0802 BP: 133/77 Pulse: 69 Resp: 18 Temp: 98.3 ?F (36.8 ?C) Weight: 70.2 kg GEN: WD/WN, no acute distressENT: oropharynx without thrush, mucositis, or bleeding. EYES: sclera anicteric, no ptosis; pupils equal, reactive to light.RESP: normal respiratory effort; lungs clear to auscultation; and percussion.CV: regular rate and rhythm, normal S1 and S2, no murmurs; no peripheral edema.ABDOMEN: soft, NT/ND, no palpable masses; no hepato- or splenomegaly.LYMPH: no palpable cervical, supraclavicular, axillary, or inguinal adenopathy.MUSCULOSKELETAL: no clubbing or cyanosis; no tenderness over the ribs, spine or long bones.SKIN: no rash or petechiae.PSYCH: judgement and insight normal; no depression or anxiety.NEURO: alert and oriented x 3; no focal motor deficits. 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 Marrow biopsy done on 10/29/20 showed 10% plasma cells with kappa restriction. No current evidence of CRAB. Continue to monitor closely.-await spep, light chains, immunoglobulins.-follow up in five months when she returns from [place]  with cbc, cmp, spep, light chains, immunoglobulins. Toribio Grow, MDElectronically Signed by Toribio Omega Grow, MD, November 14, 2025On the day of this patient's encounter, a total of 30 minutes was personally spent by me.  This does not include any resident/fellow teaching time, or any time spent performing a procedural service.

## 2023-12-20 LAB — PROTEIN ELECTROPHORESIS, SERUM    (YH)
BKR ALBUMIN ELP: 4.28 g/dL (ref 3.50–4.70)
BKR ALPHA-1-GLOBULIN: 0.16 g/dL (ref 0.10–0.30)
BKR ALPHA-2-GLOBULIN: 0.67 g/dL (ref 0.60–1.00)
BKR BETA GLOBULIN: 0.59 g/dL — ABNORMAL LOW (ref 0.70–1.20)
BKR GAMMA GLOBULIN: 2.39 g/dL — ABNORMAL HIGH (ref 0.70–1.50)

## 2023-12-21 ENCOUNTER — Encounter: Admit: 2023-12-21 | Payer: PRIVATE HEALTH INSURANCE | Attending: Cardiovascular Disease

## 2023-12-21 ENCOUNTER — Telehealth: Admit: 2023-12-21 | Payer: PRIVATE HEALTH INSURANCE

## 2023-12-21 NOTE — Telephone Encounter [36]
 Message left with [facility] that her labs are stable. Follow up as planned.

## 2023-12-22 ENCOUNTER — Inpatient Hospital Stay: Admit: 2023-12-22 | Discharge: 2023-12-22 | Payer: PRIVATE HEALTH INSURANCE

## 2023-12-22 DIAGNOSIS — R0602 Shortness of breath: Principal | ICD-10-CM

## 2023-12-22 DIAGNOSIS — Z820 Family history of epilepsy and other diseases of the nervous system: Secondary | ICD-10-CM

## 2023-12-22 DIAGNOSIS — G7111 Myotonic muscular dystrophy: Secondary | ICD-10-CM

## 2023-12-22 DIAGNOSIS — D472 Monoclonal gammopathy: Secondary | ICD-10-CM

## 2023-12-22 DIAGNOSIS — R0600 Dyspnea, unspecified: Secondary | ICD-10-CM

## 2023-12-26 ENCOUNTER — Ambulatory Visit: Admit: 2023-12-26 | Payer: PRIVATE HEALTH INSURANCE

## 2024-01-06 ENCOUNTER — Encounter: Admit: 2024-01-06 | Payer: PRIVATE HEALTH INSURANCE | Attending: Cardiovascular Disease

## 2024-05-11 ENCOUNTER — Ambulatory Visit: Admit: 2024-05-11 | Payer: PRIVATE HEALTH INSURANCE

## 2024-06-05 ENCOUNTER — Encounter: Admit: 2024-06-05 | Payer: PRIVATE HEALTH INSURANCE | Attending: Cardiovascular Disease
